# Patient Record
Sex: Male | Born: 1965 | Hispanic: No | Marital: Married | Smoking: Former smoker
Health system: Southern US, Community
[De-identification: ages and names within clinical notes are randomized; demographics above are authoritative.]

## PROBLEM LIST (undated history)

## (undated) DIAGNOSIS — I10 Essential (primary) hypertension: Secondary | ICD-10-CM

## (undated) DIAGNOSIS — N189 Chronic kidney disease, unspecified: Secondary | ICD-10-CM

## (undated) DIAGNOSIS — R011 Cardiac murmur, unspecified: Secondary | ICD-10-CM

## (undated) DIAGNOSIS — E119 Type 2 diabetes mellitus without complications: Secondary | ICD-10-CM

## (undated) DIAGNOSIS — M1990 Unspecified osteoarthritis, unspecified site: Secondary | ICD-10-CM

---

## 2007-11-21 ENCOUNTER — Emergency Department (HOSPITAL_COMMUNITY): Admission: EM | Admit: 2007-11-21 | Discharge: 2007-11-22 | Payer: Self-pay | Admitting: Emergency Medicine

## 2019-04-25 ENCOUNTER — Emergency Department (HOSPITAL_COMMUNITY)
Admission: EM | Admit: 2019-04-25 | Discharge: 2019-04-25 | Disposition: A | Discharge status: Home / Self Care | Payer: 59 | Attending: Emergency Medicine | Admitting: Emergency Medicine

## 2019-04-25 ENCOUNTER — Emergency Department (HOSPITAL_COMMUNITY): Payer: 59

## 2019-04-25 ENCOUNTER — Other Ambulatory Visit: Payer: Self-pay

## 2019-04-25 ENCOUNTER — Encounter (HOSPITAL_COMMUNITY): Payer: Self-pay

## 2019-04-25 DIAGNOSIS — E119 Type 2 diabetes mellitus without complications: Secondary | ICD-10-CM | POA: Insufficient documentation

## 2019-04-25 DIAGNOSIS — Z20822 Contact with and (suspected) exposure to covid-19: Secondary | ICD-10-CM

## 2019-04-25 DIAGNOSIS — N185 Chronic kidney disease, stage 5: Secondary | ICD-10-CM | POA: Insufficient documentation

## 2019-04-25 DIAGNOSIS — U071 COVID-19: Secondary | ICD-10-CM | POA: Diagnosis not present

## 2019-04-25 DIAGNOSIS — Z79899 Other long term (current) drug therapy: Secondary | ICD-10-CM | POA: Diagnosis not present

## 2019-04-25 DIAGNOSIS — Z794 Long term (current) use of insulin: Secondary | ICD-10-CM | POA: Insufficient documentation

## 2019-04-25 DIAGNOSIS — R05 Cough: Secondary | ICD-10-CM | POA: Diagnosis present

## 2019-04-25 LAB — CBC WITH DIFFERENTIAL/PLATELET
Abs Immature Granulocytes: 0.03 10*3/uL (ref 0.00–0.07)
Basophils Absolute: 0 10*3/uL (ref 0.0–0.1)
Basophils Relative: 0 %
Eosinophils Absolute: 0 10*3/uL (ref 0.0–0.5)
Eosinophils Relative: 0 %
HCT: 36.8 % — ABNORMAL LOW (ref 39.0–52.0)
Hemoglobin: 12.3 g/dL — ABNORMAL LOW (ref 13.0–17.0)
Immature Granulocytes: 0 %
Lymphocytes Relative: 8 %
Lymphs Abs: 0.8 10*3/uL (ref 0.7–4.0)
MCH: 30.6 pg (ref 26.0–34.0)
MCHC: 33.4 g/dL (ref 30.0–36.0)
MCV: 91.5 fL (ref 80.0–100.0)
Monocytes Absolute: 0.5 10*3/uL (ref 0.1–1.0)
Monocytes Relative: 5 %
Neutro Abs: 8.5 10*3/uL — ABNORMAL HIGH (ref 1.7–7.7)
Neutrophils Relative %: 87 %
Platelets: 218 10*3/uL (ref 150–400)
RBC: 4.02 MIL/uL — ABNORMAL LOW (ref 4.22–5.81)
RDW: 13 % (ref 11.5–15.5)
WBC: 9.8 10*3/uL (ref 4.0–10.5)
nRBC: 0 % (ref 0.0–0.2)

## 2019-04-25 LAB — BASIC METABOLIC PANEL
Anion gap: 13 (ref 5–15)
BUN: 55 mg/dL — ABNORMAL HIGH (ref 6–20)
CO2: 18 mmol/L — ABNORMAL LOW (ref 22–32)
Calcium: 8.9 mg/dL (ref 8.9–10.3)
Chloride: 106 mmol/L (ref 98–111)
Creatinine, Ser: 2.84 mg/dL — ABNORMAL HIGH (ref 0.61–1.24)
GFR calc Af Amer: 28 mL/min — ABNORMAL LOW (ref >60)
GFR calc non Af Amer: 24 mL/min — ABNORMAL LOW (ref >60)
Glucose, Bld: 212 mg/dL — ABNORMAL HIGH (ref 70–99)
Potassium: 4.1 mmol/L (ref 3.5–5.1)
Sodium: 137 mmol/L (ref 135–145)

## 2019-04-25 LAB — LIPASE, BLOOD: Lipase: 40 U/L (ref 11–51)

## 2019-04-25 MED ORDER — ONDANSETRON HCL 4 MG PO TABS: 4.0000 mg | ORAL_TABLET | Freq: Three times a day (TID) | ORAL | 0 refills | Status: AC | PRN

## 2019-04-25 MED ORDER — DOXYCYCLINE HYCLATE 100 MG PO CAPS: 100.0000 mg | ORAL_CAPSULE | Freq: Two times a day (BID) | ORAL | 0 refills | Status: AC

## 2019-04-25 MED ORDER — ACETAMINOPHEN 325 MG PO TABS
650.0000 mg | ORAL_TABLET | Freq: Once | ORAL | Status: AC
  Administered 2019-04-25: 650 mg via ORAL
  Filled 2019-04-25: qty 2

## 2019-04-25 MED ORDER — ONDANSETRON HCL 4 MG/2ML IJ SOLN
4.0000 mg | Freq: Once | INTRAMUSCULAR | Status: AC
  Administered 2019-04-25: 4 mg via INTRAVENOUS
  Filled 2019-04-25: qty 2

## 2019-04-25 MED ORDER — SODIUM CHLORIDE 0.9 % IV BOLUS
1000.0000 mL | Freq: Once | INTRAVENOUS | Status: AC
  Administered 2019-04-25: 1000 mL via INTRAVENOUS

## 2019-04-25 MED ORDER — AMOXICILLIN-POT CLAVULANATE 875-125 MG PO TABS: 1.0000 | ORAL_TABLET | Freq: Two times a day (BID) | ORAL | 0 refills | Status: AC

## 2019-04-25 NOTE — Discharge Instructions (Addendum)
Prescriptions sent to your pharmacy for:  Augmentin and doxycycline. These are antibiotics because your chest xray shows pneumonia. This could be because of your covid. These antibiotics can cause upset stomach and diarrhea.  If you covid test ends up being POSITIVE you do not need to take antibiotics because it is likely a viral pneumonia caused by covid.  Prescription also sent for Zofran. This can be taken for nausea as needed.    I suspect you have a virus. We tested your for COVID-19 (coronavirus) infection.  It is also possible you could have other viral upper respiratory infection from another virus.    Covid test results come back in 48 hours, sometimes sooner.  Someone will call you if you are positive for COVID. If the result is negative you can see it on your MyChart.  Treatment of your illness and symptoms will include self-isolation, monitoring of symptoms and supportive care with over-the-counter medicines.    Return to the ED if there is increased work of breathing, shortness of breath, inability to tolerate fluids, weakness, chest pain.  If your test results are POSITIVE, the following isolation requirements need to be met to return to work and resume essential activities: At least 10 days since symptom onset  72 hours of absence of fever without antifever medicine (acetaminophen). A fever is temperature of 100.73F or greater. Improvement of respiratory symptoms  If your test is NEGATIVE, you may return to work and essential activities as long as your symptoms have improved and you do not have a fever for a total of 3 days.  Call your job and notify them that your test result was negative to see if they will allow you to return to work.   Stay well-hydrated. Rest. You can use over the counter medications to help with symptoms: Take acetaminophen (tylenol) every 6 hours, around the clock to help with associated fevers, sore throat, headaches, generalized body aches and malaise.   Oxymetazoline (afrin) intranasal spray once daily for no more than 3 days to help with congestion, after 3 days you can switch to another over-the-counter nasal steroid spray such as fluticasone (flonase) Allergy medication (loratadine, cetirizine, etc) and phenylephrine (sudafed) help with nasal congestion, runny nose and postnasal drip.   Dextromethorphan (Delsym) to suppress dry cough. Frequent coughing is likely causing your chest wall pain Wash your hands often to prevent spread.

## 2019-04-25 NOTE — ED Notes (Signed)
POC SARS test never came to the [facility]

## 2019-04-25 NOTE — ED Triage Notes (Signed)
Pt presents via EMS from home with c/o flu-like symptoms. Pt reports he has had a slight cough and some nausea and vomiting. EMS reports that was vague with his complaints but did eventually report coming in contact with his wife whose mother is positive for Covid.

## 2019-04-25 NOTE — ED Notes (Signed)
Pulse ox while ambulating in room on room air 98%

## 2019-04-25 NOTE — ED Provider Notes (Signed)
[facility] DEPT Provider Note   CSN: [id number] Arrival date & time: 04/25/19  [id number]     History Chief Complaint  Patient presents with  . Cough  . Nausea  . Emesis    Brett Welch is a 54 y.o. male with past medical history significant for diabetes on insulin, CKD stage III, anemia on iron supplements presents to emergency department today with chief complaint of cough, nausea with emesis x3 days.  He states his mother-in-law recently tested positive for Covid, he has not been around her but has been around his wife who has been in close contact with mother-in-law. He is endorsing nonproductive cough.  He states his stomach feels sore after coughing but denies any abdominal pain.  He is also reporting nausea with nonbloody nonbilious emesis.  He has not vomited in the last 24 hours however had multiple episodes of emesis prior to that.  He has decreased p.o. intake.  He is also having diarrhea.  He is currently taking iron supplements and states his stool is always dark.  It is unchanged today and he has not seen any blood in his stool. Stool is not tar consistency.  He has not taken any medications for symptoms prior to arrival. He denies fever, chills, congestion, sore throat, chest pain, shortness of breath, gross hematuria, urinary frequency, blood in stool.  History reviewed. No pertinent past medical history.  There are no problems to display for this patient.    History reviewed. No pertinent surgical history.     No family history on file.  Social History   Tobacco Use  . Smoking status: Not on file  Substance Use Topics  . Alcohol use: Not on file  . Drug use: Not on file    Home Medications Prior to Admission medications   Medication Sig Start Date End Date Taking? Authorizing Provider  amLODipine (NORVASC) 10 MG tablet Take 10 mg by mouth daily.   Yes [provider]  atorvastatin (LIPITOR) 10 MG tablet Take 5 mg by  mouth daily.   Yes [provider]  Ferrous Fumarate (HEMOCYTE - 106 MG FE) 324 (106 Fe) MG TABS tablet Take 1 tablet by mouth daily.   Yes [provider]  glipiZIDE (GLUCOTROL) 5 MG tablet Take 5 mg by mouth 2 (two) times daily before a meal.   Yes [provider]  vitamin B-12 (CYANOCOBALAMIN) 1000 MCG tablet Take 1,000 mcg by mouth 2 (two) times daily.   Yes [provider]  amoxicillin-clavulanate (AUGMENTIN) 875-125 MG tablet Take 1 tablet by mouth 2 (two) times daily for 5 days. 04/25/19 04/30/19  Erland Vivas E, PA-C  doxycycline (VIBRAMYCIN) 100 MG capsule Take 1 capsule (100 mg total) by mouth 2 (two) times daily for 5 days. 04/25/19 04/30/19  Shataria Crist E, PA-C  ondansetron (ZOFRAN) 4 MG tablet Take 1 tablet (4 mg total) by mouth every 8 (eight) hours as needed for nausea or vomiting. 04/25/19   Duong Haydel, Harley Hallmark, PA-C    Allergies    Patient has no known allergies.  Review of Systems   Review of Systems All other systems are reviewed and are negative for acute change except as noted in the HPI.  Physical Exam Updated Vital Signs BP (!) 147/95 (BP Location: Right Arm)   Pulse 89   Temp 99.5 F (37.5 C) (Oral)   Resp 18   SpO2 95%   Physical Exam Vitals and nursing note reviewed.  Constitutional:  General: He is not in acute distress.    Appearance: He is not ill-appearing.  HENT:     Head: Normocephalic and atraumatic.     Right Ear: Tympanic membrane and external ear normal.     Left Ear: Tympanic membrane and external ear normal.     Nose: Nose normal.     Mouth/Throat:     Mouth: Mucous membranes are dry.     Pharynx: Oropharynx is clear.  Eyes:     General: No scleral icterus.       Right eye: No discharge.        Left eye: No discharge.     Extraocular Movements: Extraocular movements intact.     Conjunctiva/sclera: Conjunctivae normal.     Pupils: Pupils are equal, round, and reactive to light.  Neck:      Vascular: No JVD.  Cardiovascular:     Rate and Rhythm: Normal rate and regular rhythm.     Pulses: Normal pulses.          Radial pulses are 2+ on the right side and 2+ on the left side.     Heart sounds: Normal heart sounds.  Pulmonary:     Comments: Lungs clear to auscultation in all fields. Symmetric chest rise. No wheezing, rales, or rhonchi. Abdominal:     Comments: Abdomen is soft, non-distended, and non-tender in all quadrants. No rigidity, no guarding. No peritoneal signs.  Genitourinary:    Comments: Patient defers rectal exam Musculoskeletal:        General: Normal range of motion.     Cervical back: Normal range of motion.  Skin:    General: Skin is warm and dry.     Capillary Refill: Capillary refill takes less than 2 seconds.  Neurological:     Mental Status: He is oriented to person, place, and time.     GCS: GCS eye subscore is 4. GCS verbal subscore is 5. GCS motor subscore is 6.     Comments: Fluent speech, no facial droop.  Psychiatric:        Behavior: Behavior normal.       ED Results / Procedures / Treatments   Labs (all labs ordered are listed, but only abnormal results are displayed) Labs Reviewed  CBC WITH DIFFERENTIAL/PLATELET - Abnormal; Notable for the following components:      Result Value   RBC 4.02 (*)    Hemoglobin 12.3 (*)    HCT 36.8 (*)    Neutro Abs 8.5 (*)    All other components within normal limits  BASIC METABOLIC PANEL - Abnormal; Notable for the following components:   CO2 18 (*)    Glucose, Bld 212 (*)    BUN 55 (*)    Creatinine, Ser 2.84 (*)    GFR calc non Af Amer 24 (*)    GFR calc Af Amer 28 (*)    All other components within normal limits  SARS CORONAVIRUS 2 (TAT 6-24 HRS)  LIPASE, BLOOD  POC SARS CORONAVIRUS 2 AG -  ED    EKG None  Radiology DG Chest Portable 1 View  Result Date: 04/25/2019 CLINICAL DATA:  Pt presents c/o flu-like symptoms. Pt reports he has had a slight cough and some nausea and vomiting.  Nonsmoker. EXAM: PORTABLE CHEST 1 VIEW COMPARISON:  None. FINDINGS: Mediastinal contours within normal limits. Heart size appears mildly enlarged which may be secondary to AP portable technique. Low lung volumes. There are mild opacities at the right lung base  suspicious for infection. The left lung is clear. No pneumothorax or large pleural effusion. No acute finding in the visualized skeleton. IMPRESSION: Mild opacities at the right lung base suspicious for infection in the appropriate clinical setting. Recommend follow-up radiograph in 3-4 weeks to ensure resolution. Electronically Signed   By: Audie Pinto M.D.   On: 04/25/2019 11:57    Procedures Procedures (including critical care time)  Medications Ordered in ED Medications  sodium chloride 0.9 % bolus 1,000 mL (0 mLs Intravenous Stopped 04/25/19 1549)  ondansetron (ZOFRAN) injection 4 mg (4 mg Intravenous Given 04/25/19 1335)  acetaminophen (TYLENOL) tablet 650 mg (650 mg Oral Given 04/25/19 1336)    ED Course  I have reviewed the triage vital signs and the nursing notes.  Pertinent labs & imaging results that were available during my care of the patient were reviewed by me and considered in my medical decision making (see chart for details).    MDM Rules/Calculators/A&P                      Patient seen and examined. Patient nontoxic appearing, in no apparent distress. He has low grade temp of 99.5, no tachycardia or hypoxia.  Mucus membranes are dry. Lung sounds are diminished throughout, no respiratory distress. No abdominal tenderness, no peritoneal signs. Low suspicion for acute abdomen.  Chest xray viewed by me shows opacities in right lung base. Patient ambulated in the emergency department without respiratory distress, tachycardia, or hypoxia. SpO2 during ambulation >94% on room air. Labs today without leukocytosis, no severe electrolyte derangement. [facility] [place]. Patient has recent lab results on his phone from  04/13/2019. I viewed these results which show his creatinine 2.3, BUN 45, hemoglobin of 12. His labs today are consistent with baseline. Patient given IVF and zofran. He defers rectal exam, at this time I feel that is appropriate given he has diarrhea, but denies any blood I stool or tarry consistency.  On reassessment he is tolerating PO intake. Serial abdominal exams are benign. Updated patient on results and engaged in shared decision making. At this time he has pending covid test. Xray shows possible pneumonia. It is possible he has covid pneumonia but could also have community acquired pneumonia. Will discharge home with antibiotic coverage for CAP. Patient aware if covid test is positive he does not need to take antibiotics as this is likely viral illness. Patient is aware he needs to self quarantine until he has the covid test result.   The patient appears reasonably screened and/or stabilized for discharge and I doubt any other medical condition or other [facility] requiring further screening, evaluation, or treatment in the ED at this time prior to discharge. The patient is safe for discharge with strict return precautions discussed. Recommend pcp follow up if symptoms persist and recommend follow up chest xray in 4 weeks.  Foday Leslye Peer was evaluated in Emergency Department on 04/25/2019 for the symptoms described in the history of present illness. He was evaluated in the context of the global COVID-19 pandemic, which necessitated consideration that the patient might be at risk for infection with the SARS-CoV-2 virus that causes COVID-19. Institutional protocols and algorithms that pertain to the evaluation of patients at risk for COVID-19 are in a state of rapid change based on information released by regulatory bodies including the CDC and federal and state organizations. These policies and algorithms were followed during the patient's care in the ED.   Portions of this note  were generated with Geographical information systems officer. Dictation errors may occur despite best attempts at proofreading.   Final Clinical Impression(s) / ED Diagnoses Final diagnoses:  Exposure to COVID-19 virus    Rx / DC Orders ED Discharge Orders         Ordered    ondansetron (ZOFRAN) 4 MG tablet  Every 8 hours PRN     04/25/19 1450    amoxicillin-clavulanate (AUGMENTIN) 875-125 MG tablet  2 times daily     04/25/19 1450    doxycycline (VIBRAMYCIN) 100 MG capsule  2 times daily     04/25/19 1450           Jarrett Chicoine, Erie Noe 04/25/19 2007    Long, Joshua G, MD 04/25/19 2044

## 2019-04-26 LAB — SARS CORONAVIRUS 2 (TAT 6-24 HRS): SARS Coronavirus 2: POSITIVE — AB

## 2019-04-29 ENCOUNTER — Telehealth: Payer: Self-pay | Admitting: Unknown Physician Specialty

## 2019-04-29 NOTE — Telephone Encounter (Signed)
Called to discuss with patient about Covid symptoms and the use of bamlanivimab, a monoclonal antibody infusion for those with mild to moderate Covid symptoms and at a high risk of hospitalization.  Pt is qualified for this infusion at the [facility] infusion center due to Diabetes  Declines information

## 2019-08-03 ENCOUNTER — Ambulatory Visit: Payer: PRIVATE HEALTH INSURANCE | Attending: Internal Medicine

## 2019-08-03 DIAGNOSIS — Z23 Encounter for immunization: Secondary | ICD-10-CM

## 2019-08-03 NOTE — Progress Notes (Signed)
   Covid-19 Vaccination Clinic  Name:  Brett Welch    MRN: [id number] DOB: Dec 12, 1965  08/03/2019  [name] was observed post Covid-19 immunization for 15 minutes without incident. He was provided with Vaccine Information Sheet and instruction to access the V-Safe system.   [name] was instructed to call 911 with any severe reactions post vaccine: Marland Kitchen Difficulty breathing  . Swelling of face and throat  . A fast heartbeat  . A bad rash all over body  . Dizziness and weakness   Immunizations Administered    Name Date Dose VIS Date Route   Pfizer COVID-19 Vaccine 08/03/2019  1:08 PM 0.3 mL 04/03/2019 Intramuscular   Manufacturer: [employer]   Lot: [id number]   [place]: [id number]

## 2019-08-24 ENCOUNTER — Ambulatory Visit: Payer: PRIVATE HEALTH INSURANCE | Attending: Internal Medicine

## 2019-08-24 DIAGNOSIS — Z23 Encounter for immunization: Secondary | ICD-10-CM

## 2019-08-24 NOTE — Progress Notes (Signed)
   Covid-19 Vaccination Clinic  Name:  BRACK SHADDOCK    MRN: [id number] DOB: [birth year]  08/24/2019  Mr. Slatten was observed post Covid-19 immunization for 15 minutes without incident. He was provided with Vaccine Information Sheet and instruction to access the V-Safe system.   Mr. Foree was instructed to call 911 with any severe reactions post vaccine: Marland Kitchen Difficulty breathing  . Swelling of face and throat  . A fast heartbeat  . A bad rash all over body  . Dizziness and weakness   Immunizations Administered    Name Date Dose VIS Date Route   Pfizer COVID-19 Vaccine 08/24/2019  2:09 PM 0.3 mL 06/17/2018 Intramuscular   Manufacturer: [place]   Lot: [id number]   [place]: [id number]

## 2020-05-26 ENCOUNTER — Ambulatory Visit: Payer: Self-pay | Admitting: Student

## 2020-06-08 ENCOUNTER — Encounter (HOSPITAL_COMMUNITY): Admission: RE | Admit: 2020-06-08 | Payer: PRIVATE HEALTH INSURANCE | Source: Ambulatory Visit

## 2020-06-16 ENCOUNTER — Ambulatory Visit: Admit: 2020-06-16 | Payer: PRIVATE HEALTH INSURANCE | Admitting: Orthopedic Surgery

## 2020-06-16 SURGERY — ARTHROPLASTY, KNEE, TOTAL, USING IMAGELESS COMPUTER-ASSISTED NAVIGATION
Anesthesia: Spinal | Site: Knee | Laterality: Left

## 2020-07-12 ENCOUNTER — Ambulatory Visit: Payer: Self-pay | Admitting: Student

## 2020-08-02 ENCOUNTER — Ambulatory Visit: Payer: Self-pay | Admitting: Student

## 2020-08-02 NOTE — H&P (Signed)
TOTAL KNEE ADMISSION H&P  Patient is being admitted for left total knee arthroplasty.  Subjective:  Chief Complaint:left knee pain.  HPI: Brett Welch, 55 y.o. male, has a history of pain and functional disability in the left knee due to arthritis and has failed non-surgical conservative treatments for greater than 12 weeks to includeNSAID's and/or analgesics and activity modification.  Onset of symptoms was gradual, starting 3 years ago with gradually worsening course since that time. The patient noted no past surgery on the left knee(s).  Patient currently rates pain in the left knee(s) at 8 out of 10 with activity. Patient has worsening of pain with activity and weight bearing, pain that interferes with activities of daily living and pain with passive range of motion.  Patient has evidence of subchondral cysts, subchondral sclerosis and joint space narrowing by imaging studies. There is no active infection.  There are no problems to display for this patient.  No past medical history on file.  No past surgical history on file.  Current Outpatient Medications  Medication Sig Dispense Refill Last Dose  . acetaminophen (TYLENOL) 500 MG tablet Take 500 mg by mouth every 6 (six) hours as needed for moderate pain.     Marland Kitchen amLODipine (NORVASC) 5 MG tablet Take 5 mg by mouth daily.     Marland Kitchen atorvastatin (LIPITOR) 20 MG tablet Take 20 mg by mouth daily.     . calcitRIOL (ROCALTROL) 0.25 MCG capsule Take 0.25 mcg by mouth every Monday, Wednesday, and Friday.     . empagliflozin (JARDIANCE) 25 MG TABS tablet Take 12.5 mg by mouth daily.     . fluticasone (FLONASE) 50 MCG/ACT nasal spray Place 2 sprays into both nostrils daily.     Marland Kitchen glipiZIDE (GLUCOTROL) 5 MG tablet Take 5-10 mg by mouth See admin instructions. 10 mg in the morning, 5 mg in the evening     . lisinopril (ZESTRIL) 5 MG tablet Take 5 mg by mouth daily.     . ondansetron (ZOFRAN) 4 MG tablet Take 1 tablet (4 mg total) by mouth every 8  (eight) hours as needed for nausea or vomiting. 8 tablet 0   . vitamin B-12 (CYANOCOBALAMIN) 1000 MCG tablet Take 1,000 mcg by mouth 2 (two) times daily.      No current facility-administered medications for this visit.   No Known Allergies  Social History   Tobacco Use  . Smoking status: Not on file  . Smokeless tobacco: Not on file  Substance Use Topics  . Alcohol use: Not on file    No family history on file.   Review of Systems  Musculoskeletal: Positive for arthralgias.  All other systems reviewed and are negative.   Objective:  Physical Exam HENT:     Head: Normocephalic.  Eyes:     Pupils: Pupils are equal, round, and reactive to light.  Cardiovascular:     Rate and Rhythm: Normal rate and regular rhythm.     Pulses: Normal pulses.  Pulmonary:     Effort: Pulmonary effort is normal.  Abdominal:     Palpations: Abdomen is soft.     Tenderness: There is no abdominal tenderness.  Genitourinary:    Comments: Deferred Musculoskeletal:        General: Swelling and tenderness present.     Cervical back: Normal range of motion.  Skin:    General: Skin is warm and dry.  Neurological:     Mental Status: He is alert and oriented to person, place,  and time.  Psychiatric:        Mood and Affect: Mood normal.     Vital signs in last 24 hours: '@VSRANGES'$ @  Labs:   Estimated body mass index is 25.82 kg/m as calculated from the following:   Height as of 04/25/19: '5\' 6"'$  (1.676 m).   Weight as of 04/25/19: 72.6 kg.   Imaging Review Plain radiographs demonstrate severe degenerative joint disease of the left knee(s). The bone quality appears to be adequate for age and reported activity level.      Assessment/Plan:  End stage arthritis, left knee   The patient history, physical examination, clinical judgment of the provider and imaging studies are consistent with end stage degenerative joint disease of the left knee(s) and total knee arthroplasty is deemed  medically necessary. The treatment options including medical management, injection therapy arthroscopy and arthroplasty were discussed at length. The risks and benefits of total knee arthroplasty were presented and reviewed. The risks due to aseptic loosening, infection, stiffness, patella tracking problems, thromboembolic complications and other imponderables were discussed. The patient acknowledged the explanation, agreed to proceed with the plan and consent was signed. Patient is being admitted for inpatient treatment for surgery, pain control, PT, OT, prophylactic antibiotics, VTE prophylaxis, progressive ambulation and ADL's and discharge planning. The patient is planning to be discharged home after overnight observation     Patient's anticipated LOS is less than 2 midnights, meeting these requirements: - Younger than 36 - Lives within 1 hour of care - Has a competent adult at home to recover with post-op recover - NO history of  - Chronic pain requiring opiods  - Diabetes  - Coronary Artery Disease  - Heart failure  - Heart attack  - Stroke  - DVT/VTE  - Cardiac arrhythmia  - Respiratory Failure/COPD  - Renal failure  - Anemia  - Advanced Liver disease

## 2020-08-08 NOTE — Progress Notes (Signed)
DUE TO COVID-19 ONLY ONE VISITOR IS ALLOWED TO COME WITH YOU AND STAY IN THE WAITING ROOM ONLY DURING PRE OP AND PROCEDURE DAY OF SURGERY. THE 1 VISITOR  MAY VISIT WITH YOU AFTER SURGERY IN YOUR PRIVATE ROOM DURING VISITING HOURS ONLY!  YOU NEED TO HAVE A COVID 19 TEST ON___4/25/2022 ____ '@_______'$ , THIS TEST MUST BE DONE BEFORE SURGERY,  COVID TESTING SITE [address] [place] [address], IT IS ON THE RIGHT GOING OUT [address] APPROXIMATELY  2 MINUTES PAST [employer] ON THE RIGHT. ONCE YOUR COVID TEST IS COMPLETED,  PLEASE BEGIN THE QUARANTINE INSTRUCTIONS AS OUTLINED IN YOUR HANDOUT.                Brett Welch  08/08/2020   Your procedure is scheduled on:                    08/17/2020   Report to [facility] Main  Entrance   Report to admitting at     [place] AM     Call this number if you have problems the morning of surgery [phone]    REMEMBER: NO  SOLID FOOD CANDY OR GUM AFTER MIDNIGHT. CLEAR LIQUIDS UNTIL   0715am       . NOTHING BY MOUTH EXCEPT CLEAR LIQUIDS UNTIL    0715am     . PLEASE FINISH ENSURE DRINK PER SURGEON ORDER  WHICH NEEDS TO BE COMPLETED AT  0715am     .      CLEAR LIQUID DIET   Foods Allowed                                                                    Coffee and tea, regular and decaf                            Fruit ices (not with fruit pulp)                                      Iced Popsicles                                    Carbonated beverages, regular and diet                                    Cranberry, grape and apple juices Sports drinks like Gatorade Lightly seasoned clear broth or consume(fat free) Sugar, honey syrup ___________________________________________________________________      BRUSH YOUR TEETH MORNING OF SURGERY AND RINSE YOUR MOUTH OUT, NO CHEWING GUM CANDY OR MINTS.     Take these medicines the morning of surgery with A SIP OF WATER:     Amlodipine, flonase Hold jardiance day  before surgery.    DO NOT TAKE ANY DIABETIC MEDICATIONS DAY OF YOUR SURGERY  You may not have any metal on your body including hair pins and              piercings  Do not wear jewelry, make-up, lotions, powders or perfumes, deodorant             Do not wear nail polish on your fingernails.  Do not shave  48 hours prior to surgery.              Men may shave face and neck.   Do not bring valuables to the hospital. [place].  Contacts, dentures or bridgework may not be worn into surgery.  Leave suitcase in the car. After surgery it may be brought to your room.     Patients discharged the day of surgery will not be allowed to drive home. IF YOU ARE HAVING SURGERY AND GOING HOME THE SAME DAY, YOU MUST HAVE AN ADULT TO DRIVE YOU HOME AND BE WITH YOU FOR 24 HOURS. YOU MAY GO HOME BY TAXI OR UBER OR ORTHERWISE, BUT AN ADULT MUST ACCOMPANY YOU HOME AND STAY WITH YOU FOR 24 HOURS.  Name and phone number of your driver:  Special Instructions: N/A              Please read over the following fact sheets you were given: _____________________________________________________________________  [facility] - Preparing for Surgery Before surgery, you can play an important role.  Because skin is not sterile, your skin needs to be as free of germs as possible.  You can reduce the number of germs on your skin by washing with CHG (chlorahexidine gluconate) soap before surgery.  CHG is an antiseptic cleaner which kills germs and bonds with the skin to continue killing germs even after washing. Please DO NOT use if you have an allergy to CHG or antibacterial soaps.  If your skin becomes reddened/irritated stop using the CHG and inform your nurse when you arrive at Short Stay. Do not shave (including legs and underarms) for at least 48 hours prior to the first CHG shower.  You may shave your face/neck. Please follow these instructions  carefully:  1.  Shower with CHG Soap the night before surgery and the  morning of Surgery.  2.  If you choose to wash your hair, wash your hair first as usual with your  normal  shampoo.  3.  After you shampoo, rinse your hair and body thoroughly to remove the  shampoo.                           4.  Use CHG as you would any other liquid soap.  You can apply chg directly  to the skin and wash                       Gently with a scrungie or clean washcloth.  5.  Apply the CHG Soap to your body ONLY FROM THE NECK DOWN.   Do not use on face/ open                           Wound or open sores. Avoid contact with eyes, ears mouth and genitals (private parts).  Wash face,  Genitals (private parts) with your normal soap.             6.  Wash thoroughly, paying special attention to the area where your surgery  will be performed.  7.  Thoroughly rinse your body with warm water from the neck down.  8.  DO NOT shower/wash with your normal soap after using and rinsing off  the CHG Soap.                9.  Pat yourself dry with a clean towel.            10.  Wear clean pajamas.            11.  Place clean sheets on your bed the night of your first shower and do not  sleep with pets. Day of Surgery : Do not apply any lotions/deodorants the morning of surgery.  Please wear clean clothes to the hospital/surgery center.  FAILURE TO FOLLOW THESE INSTRUCTIONS MAY RESULT IN THE CANCELLATION OF YOUR SURGERY PATIENT SIGNATURE_________________________________  NURSE SIGNATURE__________________________________  ________________________________________________________________________

## 2020-08-10 ENCOUNTER — Encounter (HOSPITAL_COMMUNITY)
Admission: RE | Admit: 2020-08-10 | Discharge: 2020-08-10 | Disposition: A | Discharge status: Home / Self Care | Payer: 59 | Source: Ambulatory Visit | Attending: Orthopedic Surgery | Admitting: Orthopedic Surgery

## 2020-08-10 ENCOUNTER — Encounter (HOSPITAL_COMMUNITY): Payer: Self-pay

## 2020-08-10 ENCOUNTER — Other Ambulatory Visit: Payer: Self-pay

## 2020-08-10 DIAGNOSIS — Z7984 Long term (current) use of oral hypoglycemic drugs: Secondary | ICD-10-CM | POA: Diagnosis not present

## 2020-08-10 DIAGNOSIS — Z01818 Encounter for other preprocedural examination: Secondary | ICD-10-CM | POA: Insufficient documentation

## 2020-08-10 DIAGNOSIS — Z79899 Other long term (current) drug therapy: Secondary | ICD-10-CM | POA: Diagnosis not present

## 2020-08-10 DIAGNOSIS — N183 Chronic kidney disease, stage 3 unspecified: Secondary | ICD-10-CM | POA: Insufficient documentation

## 2020-08-10 DIAGNOSIS — R011 Cardiac murmur, unspecified: Secondary | ICD-10-CM | POA: Insufficient documentation

## 2020-08-10 DIAGNOSIS — M1712 Unilateral primary osteoarthritis, left knee: Secondary | ICD-10-CM | POA: Diagnosis not present

## 2020-08-10 DIAGNOSIS — E1122 Type 2 diabetes mellitus with diabetic chronic kidney disease: Secondary | ICD-10-CM | POA: Insufficient documentation

## 2020-08-10 DIAGNOSIS — I110 Hypertensive heart disease with heart failure: Secondary | ICD-10-CM | POA: Diagnosis not present

## 2020-08-10 HISTORY — DX: Unspecified osteoarthritis, unspecified site: M19.90

## 2020-08-10 HISTORY — DX: Essential (primary) hypertension: I10

## 2020-08-10 HISTORY — DX: Chronic kidney disease, unspecified: N18.9

## 2020-08-10 HISTORY — DX: Type 2 diabetes mellitus without complications: E11.9

## 2020-08-10 HISTORY — DX: Cardiac murmur, unspecified: R01.1

## 2020-08-10 LAB — COMPREHENSIVE METABOLIC PANEL
ALT: 31 U/L (ref 0–44)
AST: 22 U/L (ref 15–41)
Albumin: 4.4 g/dL (ref 3.5–5.0)
Alkaline Phosphatase: 79 U/L (ref 38–126)
Anion gap: 9 (ref 5–15)
BUN: 59 mg/dL — ABNORMAL HIGH (ref 6–20)
CO2: 18 mmol/L — ABNORMAL LOW (ref 22–32)
Calcium: 9.2 mg/dL (ref 8.9–10.3)
Chloride: 117 mmol/L — ABNORMAL HIGH (ref 98–111)
Creatinine, Ser: 2.99 mg/dL — ABNORMAL HIGH (ref 0.61–1.24)
GFR, Estimated: 24 mL/min — ABNORMAL LOW (ref >60)
Glucose, Bld: 53 mg/dL — ABNORMAL LOW (ref 70–99)
Potassium: 4 mmol/L (ref 3.5–5.1)
Sodium: 144 mmol/L (ref 135–145)
Total Bilirubin: 0.5 mg/dL (ref 0.3–1.2)
Total Protein: 7.9 g/dL (ref 6.5–8.1)

## 2020-08-10 LAB — CBC
HCT: 36.3 % — ABNORMAL LOW (ref 39.0–52.0)
Hemoglobin: 11.8 g/dL — ABNORMAL LOW (ref 13.0–17.0)
MCH: 30.7 pg (ref 26.0–34.0)
MCHC: 32.5 g/dL (ref 30.0–36.0)
MCV: 94.5 fL (ref 80.0–100.0)
Platelets: 222 10*3/uL (ref 150–400)
RBC: 3.84 MIL/uL — ABNORMAL LOW (ref 4.22–5.81)
RDW: 13.9 % (ref 11.5–15.5)
WBC: 8.8 10*3/uL (ref 4.0–10.5)
nRBC: 0 % (ref 0.0–0.2)

## 2020-08-10 LAB — URINALYSIS, ROUTINE W REFLEX MICROSCOPIC
Bacteria, UA: NONE SEEN
Bilirubin Urine: NEGATIVE
Glucose, UA: >=500 mg/dL — AB
Ketones, ur: NEGATIVE mg/dL
Leukocytes,Ua: NEGATIVE
Nitrite: NEGATIVE
Protein, ur: 100 mg/dL — AB
Specific Gravity, Urine: 1.01 (ref 1.005–1.030)
pH: 5 (ref 5.0–8.0)

## 2020-08-10 LAB — HEMOGLOBIN A1C
Hgb A1c MFr Bld: 6.7 % — ABNORMAL HIGH (ref 4.8–5.6)
Mean Plasma Glucose: 145.59 mg/dL

## 2020-08-10 LAB — SURGICAL PCR SCREEN
MRSA, PCR: NEGATIVE
Staphylococcus aureus: NEGATIVE

## 2020-08-10 NOTE — Progress Notes (Incomplete Revision)
Anesthesia Review:  PCP:  DR Domenica Fail - clearance on chart dated 07/13/20  Cardiologist : none  Chest x-ray : EKG : 08/10/20-  Echo : Stress test: Cardiac Cath :  Activity level: can do a flight of stairs without difficulty  Sleep Study/ CPAP : none  Fasting Blood Sugar :      / Checks Blood Sugar -- times a day:   Blood Thinner/ Instructions /Last Dose: ASA / Instructions/ Last Dose :  DM- type 2 - checks glucose once daily  CMP - glucose 53.  Called pt and once he left preop appt pt stated" I felt like my glucose was low and stopped and ate something.  I feel fine now".  Wife was with pt when he stopped and ate something and was with him at time of phone call.  CMP and U/A done 08/10/20 routed to Dr Lyla Glassing. Made DR Swinteck aware on cover that pt was checked on with a glucose of 53 and taht he had eaten something.   hgba1c-08/10/20-6.7

## 2020-08-10 NOTE — Progress Notes (Addendum)
Anesthesia Review:  PCP:  DR Domenica Fail - clearance on chart dated 07/13/20  Cardiologist : none  Chest x-ray : EKG : 08/10/20-  Echo : Stress test: Cardiac Cath :  Activity level: can do a flight of stairs without difficulty  Sleep Study/ CPAP : none  Fasting Blood Sugar :      / Checks Blood Sugar -- times a day:   Blood Thinner/ Instructions /Last Dose: ASA / Instructions/ Last Dose :  DM- type 2 - checks glucose once daily  CMP - glucose 53.  Called pt and once he left preop appt pt stated" I felt like my glucose was low and stopped and ate something.  I feel fine now".  Wife was with pt when he stopped and ate something and was with him at time of phone call.  CMP and U/A done 08/10/20 routed to Dr Lyla Glassing. Made DR Swinteck aware on cover that pt was checked on with a glucose of 53 and taht he had eaten something.

## 2020-08-11 ENCOUNTER — Encounter (HOSPITAL_COMMUNITY): Payer: Self-pay | Admitting: Emergency Medicine

## 2020-08-11 NOTE — Progress Notes (Signed)
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 Charlyne Petrin at [facility] cleared pt for surgery at [facility] level 2 due to creatinine >2  LABS:  - Cr 2.99, BUN 59.  This is consistent with prior labs in Epic from 04/25/19 (cr 2.84); Clearance form from Dr. Domenica Fail indicates Cr is >2. [facility] notes in care everywhere indicate Cr results 2.63 on 03/09/20 and 5.37 on 06/02/20  (all labs ordered are listed, but only abnormal results are displayed)  Labs Reviewed  CBC - Abnormal; Notable for the following components:      Result Value   RBC 3.84 (*)    Hemoglobin 11.8 (*)    HCT 36.3 (*)    All other components within normal limits  COMPREHENSIVE METABOLIC PANEL - Abnormal; Notable for the following components:   Chloride 117 (*)    CO2 18 (*)    Glucose, Bld 53 (*)    BUN 59 (*)    Creatinine, Ser 2.99 (*)    GFR, Estimated 24 (*)    All other components within normal limits  URINALYSIS, ROUTINE W REFLEX MICROSCOPIC - Abnormal; Notable for the following components:   Color, Urine STRAW (*)    Glucose, UA >=500 (*)    Hgb urine dipstick SMALL (*)    Protein, ur 100 (*)    All other components within normal limits  HEMOGLOBIN A1C - Abnormal; Notable for the following components:   Hgb A1c MFr Bld 6.7 (*)    All other components within normal limits   SURGICAL PCR SCREEN  TYPE AND SCREEN     EKG 08/10/20: NSR. Low voltage QRS.    CV: none known   Past Medical History:  Diagnosis Date  . Arthritis   . Chronic kidney disease    stage 3 - [facility]   . Diabetes mellitus without complication ([place])    type 2   . Heart murmur   . Hypertension     Past Surgical History:  Procedure Laterality Date  . surgery to correct bite problem       MEDICATIONS: . acetaminophen (TYLENOL) 500 MG tablet  . amLODipine (NORVASC) 5 MG tablet  . atorvastatin (LIPITOR) 20 MG tablet  . calcitRIOL (ROCALTROL) 0.25 MCG capsule  . empagliflozin (JARDIANCE) 25 MG TABS tablet  . fluticasone (FLONASE) 50 MCG/ACT nasal spray  . glipiZIDE (GLUCOTROL) 5 MG tablet  . lisinopril (ZESTRIL) 5 MG tablet  . ondansetron (ZOFRAN) 4 MG tablet  . vitamin B-12 (CYANOCOBALAMIN) 1000 MCG tablet   No current facility-administered medications for this encounter.    If no changes, I anticipate pt can proceed with surgery as scheduled.   Willeen Cass, PhD, FNP-BC  [facility]/Anesthesiology Phone: [phone] 08/11/2020 12:43 PM

## 2020-08-11 NOTE — Anesthesia Preprocedure Evaluation (Deleted)
Anesthesia Evaluation  Patient identified by MRN, date of birth, ID band Patient awake    Reviewed: Allergy & Precautions, NPO status , Patient's Chart, lab work & pertinent test results  Airway Mallampati: II  TM Distance: >3 FB Neck ROM: Full    Dental no notable dental hx.    Pulmonary sleep apnea , former smoker,    Pulmonary exam normal breath sounds clear to auscultation       Cardiovascular hypertension, negative cardio ROS Normal cardiovascular exam Rhythm:Regular Rate:Normal     Neuro/Psych Anxiety negative neurological ROS  negative psych ROS   GI/Hepatic Neg liver ROS, GERD  ,  Endo/Other  negative endocrine ROSdiabetes, Type 2  Renal/GU negative Renal ROS  negative genitourinary   Musculoskeletal  (+) Arthritis , Osteoarthritis,    Abdominal   Peds negative pediatric ROS (+)  Hematology negative hematology ROS (+)   Anesthesia Other Findings   Reproductive/Obstetrics negative OB ROS                            Anesthesia Physical Anesthesia Plan  ASA: 2  Anesthesia Plan: General   Post-op Pain Management: Dilaudid IV   Induction: Intravenous  PONV Risk Score and Plan: 2 and Ondansetron, Midazolam and Treatment may vary due to age or medical condition  Airway Management Planned: Oral ETT  Additional Equipment:   Intra-op Plan:   Post-operative Plan: Extubation in OR  Informed Consent: I have reviewed the patients History and Physical, chart, labs and discussed the procedure including the risks, benefits and alternatives for the proposed anesthesia with the patient or authorized representative who has indicated his/her understanding and acceptance.     Dental advisory given  Plan Discussed with: CRNA  Anesthesia Plan Comments: (See APP note by A. Yanel Dombrosky, FNP )       Anesthesia Quick Evaluation  

## 2020-08-15 ENCOUNTER — Other Ambulatory Visit (HOSPITAL_COMMUNITY)
Admission: RE | Admit: 2020-08-15 | Discharge: 2020-08-15 | Disposition: A | Discharge status: Home / Self Care | Payer: No Typology Code available for payment source | Source: Ambulatory Visit | Attending: Orthopedic Surgery | Admitting: Orthopedic Surgery

## 2020-08-15 DIAGNOSIS — Z20822 Contact with and (suspected) exposure to covid-19: Secondary | ICD-10-CM | POA: Insufficient documentation

## 2020-08-15 DIAGNOSIS — Z01812 Encounter for preprocedural laboratory examination: Secondary | ICD-10-CM | POA: Diagnosis present

## 2020-08-16 LAB — SARS CORONAVIRUS 2 (TAT 6-24 HRS): SARS Coronavirus 2: NEGATIVE

## 2020-08-17 ENCOUNTER — Encounter (HOSPITAL_COMMUNITY): Admission: RE | Payer: Self-pay | Source: Ambulatory Visit

## 2020-08-17 ENCOUNTER — Ambulatory Visit (HOSPITAL_COMMUNITY)
Admission: RE | Admit: 2020-08-17 | Payer: No Typology Code available for payment source | Source: Ambulatory Visit | Admitting: Orthopedic Surgery

## 2020-08-17 LAB — TYPE AND SCREEN
ABO/RH(D): A POS
Antibody Screen: NEGATIVE

## 2020-08-17 SURGERY — ARTHROPLASTY, KNEE, TOTAL, USING IMAGELESS COMPUTER-ASSISTED NAVIGATION
Anesthesia: Spinal | Site: Knee | Laterality: Left

## 2020-12-24 IMAGING — DX DG CHEST 1V PORT
1 series · 1 of 1 positions shown · non-contrast
Comparison: None.

CLINICAL DATA: Pt presents c/o flu-like symptoms. Pt reports he has
had a slight cough and some nausea and vomiting. Nonsmoker.

EXAM:
PORTABLE CHEST 1 VIEW

[chest ap]
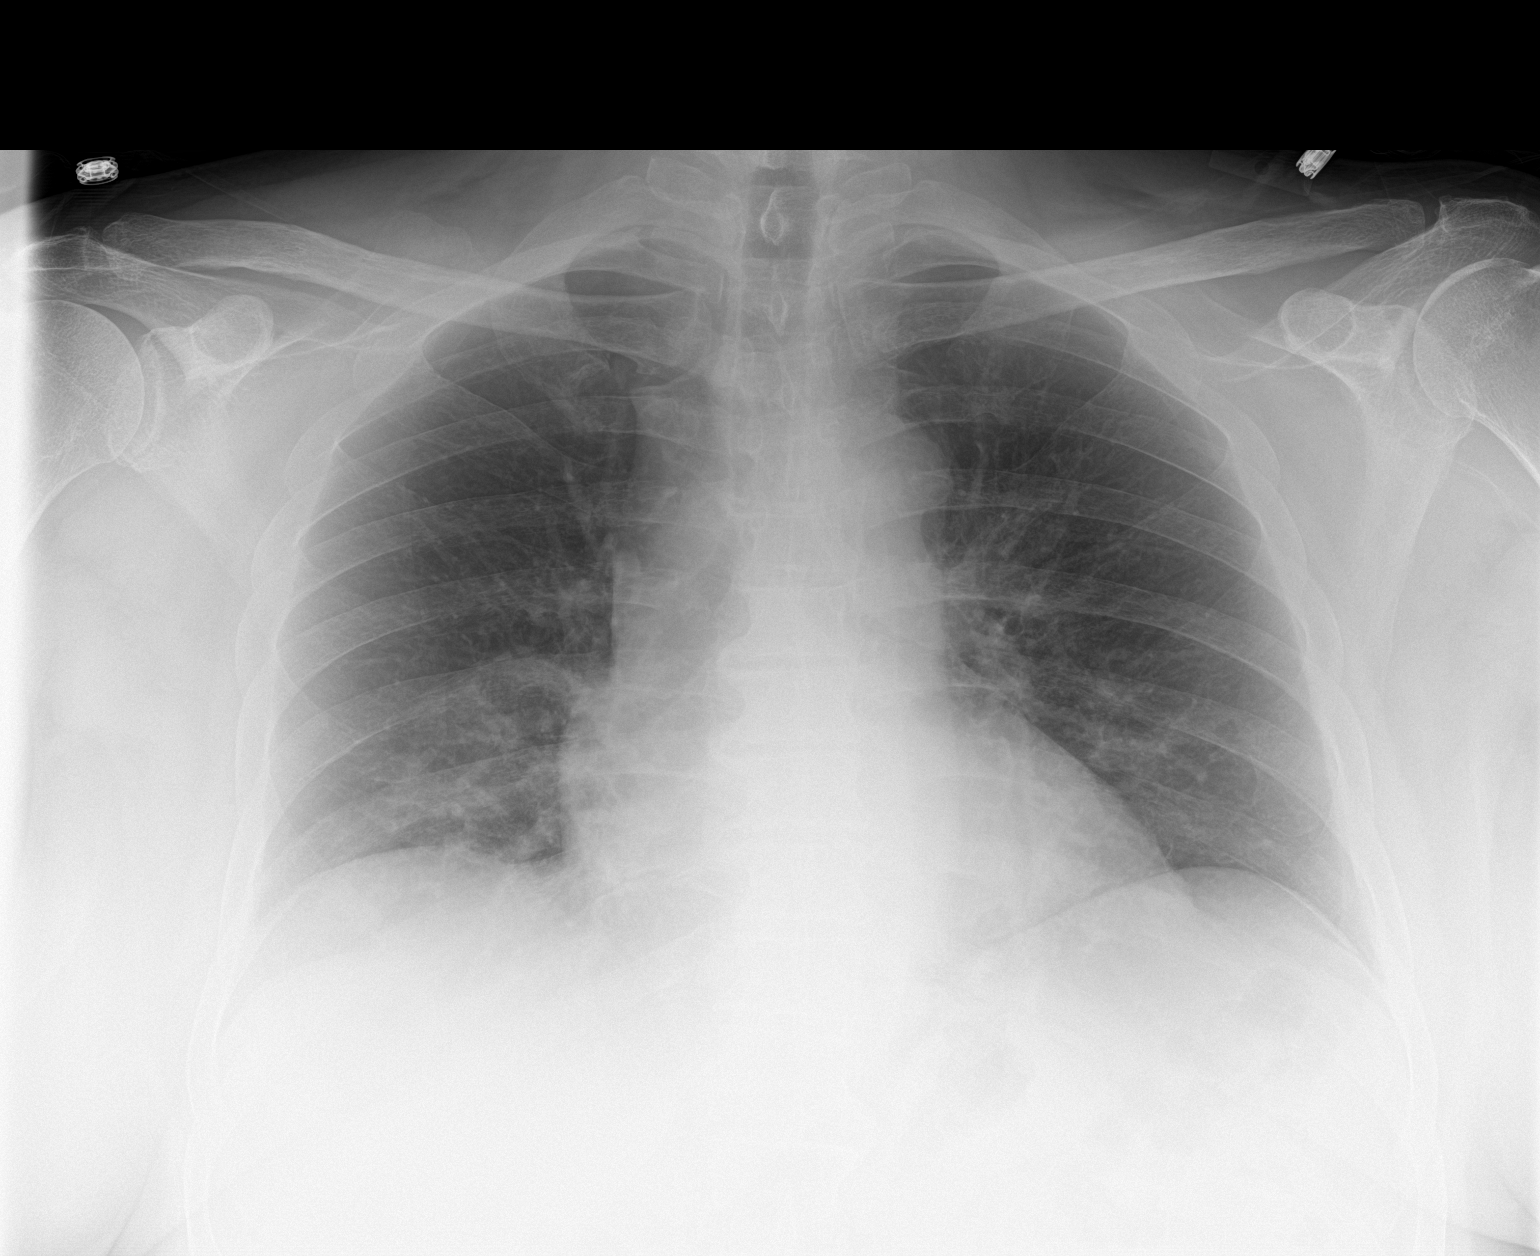

[1 of 1 positions shown; findings below may reference images not displayed]

FINDINGS: Mediastinal contours within normal limits. Heart size appears mildly
enlarged which may be secondary to AP portable technique. Low lung
volumes. There are mild opacities at the right lung base suspicious
for infection. The left lung is clear. No pneumothorax or large
pleural effusion. No acute finding in the visualized skeleton.
IMPRESSION: Mild opacities at the right lung base suspicious for infection in
the appropriate clinical setting. Recommend follow-up radiograph in
3-4 weeks to ensure resolution.
